# Patient Record
Sex: Male | Born: 1985 | Race: Black or African American | Hispanic: No | Smoking: Current every day smoker
Health system: Southern US, Community
[De-identification: ages and names within clinical notes are randomized; demographics above are authoritative.]

## PROBLEM LIST (undated history)

## (undated) DIAGNOSIS — W3400XA Accidental discharge from unspecified firearms or gun, initial encounter: Secondary | ICD-10-CM

## (undated) HISTORY — PX: NO PAST SURGERIES: SHX2092

---

## 2017-10-29 ENCOUNTER — Emergency Department (HOSPITAL_COMMUNITY): Payer: Medicaid Other

## 2017-10-29 ENCOUNTER — Emergency Department (HOSPITAL_COMMUNITY): Payer: Medicaid Other | Admitting: Anesthesiology

## 2017-10-29 ENCOUNTER — Inpatient Hospital Stay (HOSPITAL_COMMUNITY)
Admission: EM | Admit: 2017-10-29 | Discharge: 2017-11-05 | DRG: 957 | Disposition: A | Discharge status: Home Health | Payer: Medicaid Other | Attending: General Surgery | Admitting: General Surgery

## 2017-10-29 ENCOUNTER — Inpatient Hospital Stay (HOSPITAL_COMMUNITY): Payer: Medicaid Other

## 2017-10-29 ENCOUNTER — Encounter (HOSPITAL_COMMUNITY): Payer: Self-pay | Admitting: Emergency Medicine

## 2017-10-29 ENCOUNTER — Encounter (HOSPITAL_COMMUNITY): Admission: EM | Disposition: A | Discharge status: Home Health | Payer: Self-pay | Source: Home / Self Care

## 2017-10-29 DIAGNOSIS — D72829 Elevated white blood cell count, unspecified: Secondary | ICD-10-CM

## 2017-10-29 DIAGNOSIS — Z23 Encounter for immunization: Secondary | ICD-10-CM

## 2017-10-29 DIAGNOSIS — J9601 Acute respiratory failure with hypoxia: Secondary | ICD-10-CM | POA: Diagnosis not present

## 2017-10-29 DIAGNOSIS — R571 Hypovolemic shock: Secondary | ICD-10-CM | POA: Diagnosis present

## 2017-10-29 DIAGNOSIS — E876 Hypokalemia: Secondary | ICD-10-CM | POA: Diagnosis not present

## 2017-10-29 DIAGNOSIS — W3400XA Accidental discharge from unspecified firearms or gun, initial encounter: Secondary | ICD-10-CM

## 2017-10-29 DIAGNOSIS — S62397B Other fracture of fifth metacarpal bone, left hand, initial encounter for open fracture: Secondary | ICD-10-CM | POA: Diagnosis present

## 2017-10-29 DIAGNOSIS — S37092A Other injury of left kidney, initial encounter: Secondary | ICD-10-CM | POA: Diagnosis present

## 2017-10-29 DIAGNOSIS — S31631A Puncture wound without foreign body of abdominal wall, left upper quadrant with penetration into peritoneal cavity, initial encounter: Principal | ICD-10-CM | POA: Diagnosis present

## 2017-10-29 DIAGNOSIS — S31139A Puncture wound of abdominal wall without foreign body, unspecified quadrant without penetration into peritoneal cavity, initial encounter: Secondary | ICD-10-CM

## 2017-10-29 DIAGNOSIS — K659 Peritonitis, unspecified: Secondary | ICD-10-CM | POA: Diagnosis present

## 2017-10-29 DIAGNOSIS — S3639XA Other injury of stomach, initial encounter: Secondary | ICD-10-CM | POA: Diagnosis present

## 2017-10-29 DIAGNOSIS — S6292XA Unspecified fracture of left wrist and hand, initial encounter for closed fracture: Secondary | ICD-10-CM

## 2017-10-29 DIAGNOSIS — D62 Acute posthemorrhagic anemia: Secondary | ICD-10-CM | POA: Diagnosis not present

## 2017-10-29 DIAGNOSIS — K567 Ileus, unspecified: Secondary | ICD-10-CM | POA: Diagnosis not present

## 2017-10-29 DIAGNOSIS — S36592A Other injury of descending [left] colon, initial encounter: Secondary | ICD-10-CM | POA: Diagnosis present

## 2017-10-29 DIAGNOSIS — S32302A Unspecified fracture of left ilium, initial encounter for closed fracture: Secondary | ICD-10-CM | POA: Diagnosis present

## 2017-10-29 DIAGNOSIS — K117 Disturbances of salivary secretion: Secondary | ICD-10-CM

## 2017-10-29 DIAGNOSIS — R739 Hyperglycemia, unspecified: Secondary | ICD-10-CM | POA: Diagnosis not present

## 2017-10-29 DIAGNOSIS — S36115A Moderate laceration of liver, initial encounter: Secondary | ICD-10-CM | POA: Diagnosis present

## 2017-10-29 DIAGNOSIS — R109 Unspecified abdominal pain: Secondary | ICD-10-CM | POA: Diagnosis present

## 2017-10-29 HISTORY — DX: Accidental discharge from unspecified firearms or gun, initial encounter: W34.00XA

## 2017-10-29 HISTORY — PX: EXPLORATORY LAPAROTOMY: SUR591

## 2017-10-29 HISTORY — PX: LAPAROTOMY: SHX154

## 2017-10-29 LAB — POCT I-STAT 3, ART BLOOD GAS (G3+)
ACID-BASE DEFICIT: 6 mmol/L — AB (ref 0.0–2.0)
BICARBONATE: 21.3 mmol/L (ref 20.0–28.0)
O2 Saturation: 100 %
TCO2: 23 mmol/L (ref 22–32)
pCO2 arterial: 45.7 mmHg (ref 32.0–48.0)
pH, Arterial: 7.277 — ABNORMAL LOW (ref 7.350–7.450)
pO2, Arterial: 274 mmHg — ABNORMAL HIGH (ref 83.0–108.0)

## 2017-10-29 LAB — DIC (DISSEMINATED INTRAVASCULAR COAGULATION) PANEL
APTT: 29 s (ref 24–36)
D DIMER QUANT: 3.37 ug{FEU}/mL — AB (ref 0.00–0.50)
INR: 1.35
PLATELETS: 143 10*3/uL — AB (ref 150–400)
PROTHROMBIN TIME: 16.5 s — AB (ref 11.4–15.2)

## 2017-10-29 LAB — URINALYSIS, ROUTINE W REFLEX MICROSCOPIC
BILIRUBIN URINE: NEGATIVE
Glucose, UA: NEGATIVE mg/dL
Ketones, ur: NEGATIVE mg/dL
Leukocytes, UA: NEGATIVE
NITRITE: NEGATIVE
PH: 5 (ref 5.0–8.0)
Protein, ur: 100 mg/dL — AB
RBC / HPF: >50 RBC/hpf — ABNORMAL HIGH (ref 0–5)
SPECIFIC GRAVITY, URINE: 1.021 (ref 1.005–1.030)

## 2017-10-29 LAB — CDS SEROLOGY

## 2017-10-29 LAB — CBC
HEMATOCRIT: 39.7 % (ref 39.0–52.0)
HEMATOCRIT: 35.3 % — AB (ref 39.0–52.0)
Hemoglobin: 12.8 g/dL — ABNORMAL LOW (ref 13.0–17.0)
Hemoglobin: 11.4 g/dL — ABNORMAL LOW (ref 13.0–17.0)
MCH: 31.7 pg (ref 26.0–34.0)
MCH: 30.8 pg (ref 26.0–34.0)
MCHC: 32.2 g/dL (ref 30.0–36.0)
MCHC: 32.3 g/dL (ref 30.0–36.0)
MCV: 98.3 fL (ref 80.0–100.0)
MCV: 95.4 fL (ref 80.0–100.0)
NRBC: 0 % (ref 0.0–0.2)
PLATELETS: 227 10*3/uL (ref 150–400)
Platelets: 134 10*3/uL — ABNORMAL LOW (ref 150–400)
RBC: 4.04 MIL/uL — AB (ref 4.22–5.81)
RBC: 3.7 MIL/uL — AB (ref 4.22–5.81)
RDW: 12.1 % (ref 11.5–15.5)
RDW: 13.2 % (ref 11.5–15.5)
WBC: 10.8 10*3/uL — ABNORMAL HIGH (ref 4.0–10.5)
WBC: 17 10*3/uL — ABNORMAL HIGH (ref 4.0–10.5)
nRBC: 0 % (ref 0.0–0.2)

## 2017-10-29 LAB — COMPREHENSIVE METABOLIC PANEL
ALBUMIN: 4.1 g/dL (ref 3.5–5.0)
ALK PHOS: 37 U/L — AB (ref 38–126)
ALT: 47 U/L — ABNORMAL HIGH (ref 0–44)
ALT: 67 U/L — ABNORMAL HIGH (ref 0–44)
ANION GAP: 14 (ref 5–15)
ANION GAP: 14 (ref 5–15)
AST: 61 U/L — ABNORMAL HIGH (ref 15–41)
AST: 85 U/L — ABNORMAL HIGH (ref 15–41)
Albumin: 2.8 g/dL — ABNORMAL LOW (ref 3.5–5.0)
Alkaline Phosphatase: 40 U/L (ref 38–126)
BUN: 12 mg/dL (ref 6–20)
BUN: 11 mg/dL (ref 6–20)
CALCIUM: 7.6 mg/dL — AB (ref 8.9–10.3)
CO2: 18 mmol/L — AB (ref 22–32)
CO2: 18 mmol/L — AB (ref 22–32)
Calcium: 8.7 mg/dL — ABNORMAL LOW (ref 8.9–10.3)
Chloride: 106 mmol/L (ref 98–111)
Chloride: 107 mmol/L (ref 98–111)
Creatinine, Ser: 1.43 mg/dL — ABNORMAL HIGH (ref 0.61–1.24)
Creatinine, Ser: 1.13 mg/dL (ref 0.61–1.24)
GFR calc non Af Amer: 28 mL/min — ABNORMAL LOW (ref >60)
GFR, EST AFRICAN AMERICAN: 33 mL/min — AB (ref >60)
GFR, EST AFRICAN AMERICAN: 44 mL/min — AB (ref >60)
GFR, EST NON AFRICAN AMERICAN: 38 mL/min — AB (ref >60)
GLUCOSE: 152 mg/dL — AB (ref 70–99)
Glucose, Bld: 153 mg/dL — ABNORMAL HIGH (ref 70–99)
POTASSIUM: 3.1 mmol/L — AB (ref 3.5–5.1)
Potassium: 3.8 mmol/L (ref 3.5–5.1)
SODIUM: 138 mmol/L (ref 135–145)
SODIUM: 139 mmol/L (ref 135–145)
TOTAL PROTEIN: 4.7 g/dL — AB (ref 6.5–8.1)
Total Bilirubin: 1.2 mg/dL (ref 0.3–1.2)
Total Bilirubin: 0.8 mg/dL (ref 0.3–1.2)
Total Protein: 6.6 g/dL (ref 6.5–8.1)

## 2017-10-29 LAB — POCT I-STAT, CHEM 8
BUN: 13 mg/dL (ref 6–20)
CALCIUM ION: 1.07 mmol/L — AB (ref 1.15–1.40)
Chloride: 103 mmol/L (ref 98–111)
Creatinine, Ser: 1.7 mg/dL — ABNORMAL HIGH (ref 0.61–1.24)
GLUCOSE: 149 mg/dL — AB (ref 70–99)
HEMATOCRIT: 41 % (ref 39.0–52.0)
Hemoglobin: 13.9 g/dL (ref 13.0–17.0)
Potassium: 3.2 mmol/L — ABNORMAL LOW (ref 3.5–5.1)
SODIUM: 141 mmol/L (ref 135–145)
TCO2: 19 mmol/L — AB (ref 22–32)

## 2017-10-29 LAB — GLUCOSE, CAPILLARY
Glucose-Capillary: 111 mg/dL — ABNORMAL HIGH (ref 70–99)
Glucose-Capillary: 113 mg/dL — ABNORMAL HIGH (ref 70–99)
Glucose-Capillary: 119 mg/dL — ABNORMAL HIGH (ref 70–99)
Glucose-Capillary: 115 mg/dL — ABNORMAL HIGH (ref 70–99)

## 2017-10-29 LAB — ABO/RH: ABO/RH(D): O POS

## 2017-10-29 LAB — I-STAT CHEM 8, ED
BUN: 13 mg/dL (ref 8–23)
CALCIUM ION: 1.07 mmol/L — AB (ref 1.15–1.40)
CHLORIDE: 103 mmol/L (ref 98–111)
Creatinine, Ser: 1.7 mg/dL — ABNORMAL HIGH (ref 0.61–1.24)
Glucose, Bld: 149 mg/dL — ABNORMAL HIGH (ref 70–99)
HEMATOCRIT: 41 % (ref 39.0–52.0)
Hemoglobin: 13.9 g/dL (ref 13.0–17.0)
POTASSIUM: 3.2 mmol/L — AB (ref 3.5–5.1)
SODIUM: 141 mmol/L (ref 135–145)
TCO2: 19 mmol/L — ABNORMAL LOW (ref 22–32)

## 2017-10-29 LAB — MRSA PCR SCREENING: MRSA BY PCR: NEGATIVE

## 2017-10-29 LAB — BLOOD PRODUCT ORDER (VERBAL) VERIFICATION

## 2017-10-29 LAB — ETHANOL: Alcohol, Ethyl (B): 154 mg/dL — ABNORMAL HIGH (ref <10)

## 2017-10-29 LAB — PROTIME-INR
INR: 1.12
Prothrombin Time: 14.3 seconds (ref 11.4–15.2)

## 2017-10-29 LAB — DIC (DISSEMINATED INTRAVASCULAR COAGULATION)PANEL
Fibrinogen: 199 mg/dL — ABNORMAL LOW (ref 210–475)
Smear Review: NONE SEEN

## 2017-10-29 LAB — HIV ANTIBODY (ROUTINE TESTING W REFLEX): HIV SCREEN 4TH GENERATION: NONREACTIVE

## 2017-10-29 LAB — I-STAT CG4 LACTIC ACID, ED: Lactic Acid, Venous: 7.2 mmol/L (ref 0.5–1.9)

## 2017-10-29 SURGERY — LAPAROTOMY, EXPLORATORY
Anesthesia: General | Site: Abdomen

## 2017-10-29 MED ORDER — 0.9 % SODIUM CHLORIDE (POUR BTL) OPTIME
TOPICAL | Status: DC | PRN
  Administered 2017-10-29: 1000 mL

## 2017-10-29 MED ORDER — FENTANYL CITRATE (PF) 250 MCG/5ML IJ SOLN
INTRAMUSCULAR | Status: DC | PRN
  Administered 2017-10-29: 100 ug via INTRAVENOUS
  Administered 2017-10-29: 50 ug via INTRAVENOUS
  Administered 2017-10-29: 100 ug via INTRAVENOUS

## 2017-10-29 MED ORDER — CEFAZOLIN SODIUM-DEXTROSE 1-4 GM/50ML-% IV SOLN
1.0000 g | Freq: Three times a day (TID) | INTRAVENOUS | Status: DC
  Filled 2017-10-29: qty 50

## 2017-10-29 MED ORDER — FENTANYL CITRATE (PF) 100 MCG/2ML IJ SOLN
INTRAMUSCULAR | Status: AC | PRN
  Administered 2017-10-29: 100 ug via INTRAVENOUS

## 2017-10-29 MED ORDER — PROPOFOL 10 MG/ML IV BOLUS
INTRAVENOUS | Status: DC | PRN
  Administered 2017-10-29: 130 mg via INTRAVENOUS

## 2017-10-29 MED ORDER — CHLORHEXIDINE GLUCONATE 0.12% ORAL RINSE (MEDLINE KIT)
15.0000 mL | Freq: Two times a day (BID) | OROMUCOSAL | Status: DC
  Administered 2017-10-29 – 2017-11-04 (×10): 15 mL via OROMUCOSAL

## 2017-10-29 MED ORDER — CEFAZOLIN SODIUM 1 G IJ SOLR
INTRAMUSCULAR | Status: AC
  Filled 2017-10-29: qty 40

## 2017-10-29 MED ORDER — CALCIUM CHLORIDE 10 % IV SOLN
INTRAVENOUS | Status: AC
  Filled 2017-10-29: qty 10

## 2017-10-29 MED ORDER — CEFAZOLIN SODIUM-DEXTROSE 1-4 GM/50ML-% IV SOLN
1.0000 g | Freq: Once | INTRAVENOUS | Status: AC
  Administered 2017-10-29: 2 g via INTRAVENOUS

## 2017-10-29 MED ORDER — ROCURONIUM BROMIDE 50 MG/5ML IV SOSY
PREFILLED_SYRINGE | INTRAVENOUS | Status: AC
  Filled 2017-10-29: qty 10

## 2017-10-29 MED ORDER — FENTANYL CITRATE (PF) 100 MCG/2ML IJ SOLN
INTRAMUSCULAR | Status: AC
  Filled 2017-10-29: qty 2

## 2017-10-29 MED ORDER — SODIUM CHLORIDE 0.9 % IV SOLN
INTRAVENOUS | Status: DC
  Administered 2017-10-29 – 2017-10-30 (×3): via INTRAVENOUS

## 2017-10-29 MED ORDER — DEXMEDETOMIDINE HCL IN NACL 400 MCG/100ML IV SOLN
0.0000 ug/kg/h | INTRAVENOUS | Status: DC
  Administered 2017-10-29 (×3): 1.2 ug/kg/h via INTRAVENOUS
  Administered 2017-10-29: 0.6 ug/kg/h via INTRAVENOUS
  Administered 2017-10-29 – 2017-10-30 (×3): 1.2 ug/kg/h via INTRAVENOUS
  Filled 2017-10-29 (×7): qty 100

## 2017-10-29 MED ORDER — DOUBLE ANTIBIOTIC 500-10000 UNIT/GM EX OINT
TOPICAL_OINTMENT | CUTANEOUS | Status: AC
  Filled 2017-10-29: qty 1

## 2017-10-29 MED ORDER — FENTANYL BOLUS VIA INFUSION
50.0000 ug | INTRAVENOUS | Status: DC | PRN
  Administered 2017-10-29: 50 ug via INTRAVENOUS
  Filled 2017-10-29: qty 50

## 2017-10-29 MED ORDER — INSULIN ASPART 100 UNIT/ML ~~LOC~~ SOLN: 0.0000 [IU] | SUBCUTANEOUS | Status: DC

## 2017-10-29 MED ORDER — ONDANSETRON HCL 4 MG/2ML IJ SOLN
INTRAMUSCULAR | Status: AC
  Filled 2017-10-29: qty 2

## 2017-10-29 MED ORDER — MIDAZOLAM HCL 2 MG/2ML IJ SOLN
INTRAMUSCULAR | Status: AC
  Filled 2017-10-29: qty 2

## 2017-10-29 MED ORDER — MIDAZOLAM HCL 2 MG/2ML IJ SOLN
INTRAMUSCULAR | Status: DC | PRN
  Administered 2017-10-29: 2 mg via INTRAVENOUS

## 2017-10-29 MED ORDER — PROPOFOL 1000 MG/100ML IV EMUL: 5.0000 ug/kg/min | INTRAVENOUS | Status: DC

## 2017-10-29 MED ORDER — FAMOTIDINE IN NACL 20-0.9 MG/50ML-% IV SOLN
20.0000 mg | Freq: Two times a day (BID) | INTRAVENOUS | Status: DC
  Administered 2017-10-29 – 2017-11-03 (×12): 20 mg via INTRAVENOUS
  Filled 2017-10-29 (×13): qty 50

## 2017-10-29 MED ORDER — SUCCINYLCHOLINE CHLORIDE 200 MG/10ML IV SOSY
PREFILLED_SYRINGE | INTRAVENOUS | Status: AC
  Filled 2017-10-29: qty 10

## 2017-10-29 MED ORDER — PROPOFOL 10 MG/ML IV BOLUS
INTRAVENOUS | Status: AC
  Filled 2017-10-29: qty 20

## 2017-10-29 MED ORDER — ALBUMIN HUMAN 5 % IV SOLN
INTRAVENOUS | Status: DC | PRN
  Administered 2017-10-29: 04:00:00 via INTRAVENOUS

## 2017-10-29 MED ORDER — BACITRACIN-NEOMYCIN-POLYMYXIN OINTMENT TUBE
TOPICAL_OINTMENT | CUTANEOUS | Status: DC | PRN
  Administered 2017-10-29: 1 via TOPICAL

## 2017-10-29 MED ORDER — ROCURONIUM BROMIDE 100 MG/10ML IV SOLN
INTRAVENOUS | Status: DC | PRN
  Administered 2017-10-29: 50 mg via INTRAVENOUS
  Administered 2017-10-29: 30 mg via INTRAVENOUS
  Administered 2017-10-29: 20 mg via INTRAVENOUS

## 2017-10-29 MED ORDER — SUCCINYLCHOLINE CHLORIDE 20 MG/ML IJ SOLN
INTRAMUSCULAR | Status: DC | PRN
  Administered 2017-10-29: 140 mg via INTRAVENOUS

## 2017-10-29 MED ORDER — TETANUS-DIPHTH-ACELL PERTUSSIS 5-2.5-18.5 LF-MCG/0.5 IM SUSP: 0.5000 mL | Freq: Once | INTRAMUSCULAR | Status: DC

## 2017-10-29 MED ORDER — FENTANYL 2500MCG IN NS 250ML (10MCG/ML) PREMIX INFUSION
25.0000 ug/h | INTRAVENOUS | Status: DC
  Administered 2017-10-29: 300 ug/h via INTRAVENOUS
  Administered 2017-10-29 (×2): 350 ug/h via INTRAVENOUS
  Administered 2017-10-30: 250 ug/h via INTRAVENOUS
  Filled 2017-10-29 (×4): qty 250

## 2017-10-29 MED ORDER — LIDOCAINE 2% (20 MG/ML) 5 ML SYRINGE
INTRAMUSCULAR | Status: AC
  Filled 2017-10-29: qty 5

## 2017-10-29 MED ORDER — SODIUM CHLORIDE 0.9 % IJ SOLN
INTRAMUSCULAR | Status: AC
  Filled 2017-10-29: qty 10

## 2017-10-29 MED ORDER — FENTANYL CITRATE (PF) 250 MCG/5ML IJ SOLN
INTRAMUSCULAR | Status: AC
  Filled 2017-10-29: qty 5

## 2017-10-29 MED ORDER — FENTANYL CITRATE (PF) 100 MCG/2ML IJ SOLN
50.0000 ug | Freq: Once | INTRAMUSCULAR | Status: DC
  Filled 2017-10-29: qty 2

## 2017-10-29 MED ORDER — ORAL CARE MOUTH RINSE
15.0000 mL | OROMUCOSAL | Status: DC
  Administered 2017-10-29 – 2017-10-30 (×11): 15 mL via OROMUCOSAL

## 2017-10-29 MED ORDER — CALCIUM CHLORIDE 10 % IV SOLN
INTRAVENOUS | Status: DC | PRN
  Administered 2017-10-29 (×3): 200 mg via INTRAVENOUS
  Administered 2017-10-29: 400 mg via INTRAVENOUS

## 2017-10-29 MED ORDER — LACTATED RINGERS IV SOLN
INTRAVENOUS | Status: DC | PRN
  Administered 2017-10-29 (×2): via INTRAVENOUS

## 2017-10-29 MED ORDER — SODIUM CHLORIDE 0.9 % IV SOLN
2.0000 g | Freq: Two times a day (BID) | INTRAVENOUS | Status: AC
  Administered 2017-10-29 – 2017-10-30 (×3): 2 g via INTRAVENOUS
  Filled 2017-10-29 (×7): qty 2

## 2017-10-29 MED ORDER — SODIUM CHLORIDE 0.9 % IV SOLN
INTRAVENOUS | Status: AC | PRN
  Administered 2017-10-29 (×2): 1000 mL via INTRAVENOUS

## 2017-10-29 MED ORDER — PHENYLEPHRINE 40 MCG/ML (10ML) SYRINGE FOR IV PUSH (FOR BLOOD PRESSURE SUPPORT)
PREFILLED_SYRINGE | INTRAVENOUS | Status: AC
  Filled 2017-10-29: qty 10

## 2017-10-29 MED ORDER — ARTIFICIAL TEARS OPHTHALMIC OINT
TOPICAL_OINTMENT | OPHTHALMIC | Status: AC
  Filled 2017-10-29: qty 3.5

## 2017-10-29 MED ORDER — EPHEDRINE 5 MG/ML INJ
INTRAVENOUS | Status: AC
  Filled 2017-10-29: qty 10

## 2017-10-29 SURGICAL SUPPLY — 61 items
BLADE CLIPPER SURG (BLADE) ×2 IMPLANT
BNDG GAUZE ELAST 4 BULKY (GAUZE/BANDAGES/DRESSINGS) ×4 IMPLANT
COVER SURGICAL LIGHT HANDLE (MISCELLANEOUS) ×2 IMPLANT
COVER WAND RF STERILE (DRAPES) ×2 IMPLANT
DRAPE LAPAROSCOPIC ABDOMINAL (DRAPES) ×2 IMPLANT
DRAPE WARM FLUID 44X44 (DRAPE) ×2 IMPLANT
DRSG OPSITE POSTOP 4X10 (GAUZE/BANDAGES/DRESSINGS) IMPLANT
DRSG OPSITE POSTOP 4X8 (GAUZE/BANDAGES/DRESSINGS) IMPLANT
ELECT BLADE 4.0 EZ CLEAN MEGAD (MISCELLANEOUS) ×2
ELECT BLADE 6.5 EXT (BLADE) IMPLANT
ELECT CAUTERY BLADE 6.4 (BLADE) ×2 IMPLANT
ELECT REM PT RETURN 9FT ADLT (ELECTROSURGICAL) ×2
ELECTRODE BLDE 4.0 EZ CLN MEGD (MISCELLANEOUS) ×1 IMPLANT
ELECTRODE REM PT RTRN 9FT ADLT (ELECTROSURGICAL) ×1 IMPLANT
EVACUATOR SILICONE 100CC (DRAIN) ×2 IMPLANT
GAUZE SPONGE 4X4 12PLY STRL (GAUZE/BANDAGES/DRESSINGS) ×2 IMPLANT
GAUZE SPONGE 4X4 12PLY STRL LF (GAUZE/BANDAGES/DRESSINGS) ×2 IMPLANT
GLOVE BIOGEL PI IND STRL 7.0 (GLOVE) ×1 IMPLANT
GLOVE BIOGEL PI IND STRL 8 (GLOVE) ×1 IMPLANT
GLOVE BIOGEL PI INDICATOR 7.0 (GLOVE) ×1
GLOVE BIOGEL PI INDICATOR 8 (GLOVE) ×1
GLOVE ECLIPSE 7.5 STRL STRAW (GLOVE) ×2 IMPLANT
GLOVE INDICATOR 7.0 STRL GRN (GLOVE) ×2 IMPLANT
GLOVE SURG SIGNA 7.5 PF LTX (GLOVE) ×2 IMPLANT
GOWN STRL REUS W/ TWL LRG LVL3 (GOWN DISPOSABLE) ×2 IMPLANT
GOWN STRL REUS W/TWL LRG LVL3 (GOWN DISPOSABLE) ×2
HEMOSTAT SNOW SURGICEL 2X4 (HEMOSTASIS) ×2 IMPLANT
KIT BASIN OR (CUSTOM PROCEDURE TRAY) ×2 IMPLANT
KIT OSTOMY DRAINABLE 2.75 STR (WOUND CARE) ×2 IMPLANT
KIT TURNOVER KIT B (KITS) ×2 IMPLANT
LIGASURE IMPACT 36 18CM CVD LR (INSTRUMENTS) ×2 IMPLANT
NS IRRIG 1000ML POUR BTL (IV SOLUTION) ×4 IMPLANT
PACK GENERAL/GYN (CUSTOM PROCEDURE TRAY) ×2 IMPLANT
PAD ABD 8X10 STRL (GAUZE/BANDAGES/DRESSINGS) ×2 IMPLANT
PAD ARMBOARD 7.5X6 YLW CONV (MISCELLANEOUS) ×2 IMPLANT
RELOAD PROXIMATE 30MM BLUE (ENDOMECHANICALS) ×2 IMPLANT
RELOAD PROXIMATE 75MM BLUE (ENDOMECHANICALS) ×2 IMPLANT
RELOAD STAPLER LINEAR PROX 30 (STAPLE) ×1 IMPLANT
SEALANT PATCH FIBRIN 2X4IN (MISCELLANEOUS) ×4 IMPLANT
SEPRAFILM PROCEDURAL PACK 3X5 (MISCELLANEOUS) IMPLANT
SPECIMEN JAR LARGE (MISCELLANEOUS) IMPLANT
SPONGE LAP 18X18 X RAY DECT (DISPOSABLE) ×2 IMPLANT
STAPLER PROXIMATE 75MM BLUE (STAPLE) ×2 IMPLANT
STAPLER RELOAD LINEAR PROX 30 (STAPLE) ×2
STAPLER VISISTAT 35W (STAPLE) ×2 IMPLANT
SUCTION POOLE TIP (SUCTIONS) ×2 IMPLANT
SUT ETHILON 2 0 FS 18 (SUTURE) ×2 IMPLANT
SUT NOVA 1 T20/GS 25DT (SUTURE) IMPLANT
SUT PDS AB 1 TP1 96 (SUTURE) ×4 IMPLANT
SUT PROLENE 2 0 CT2 30 (SUTURE) ×2 IMPLANT
SUT SILK 2 0 SH CR/8 (SUTURE) ×4 IMPLANT
SUT SILK 2 0 TIES 10X30 (SUTURE) ×2 IMPLANT
SUT SILK 3 0 SH CR/8 (SUTURE) ×2 IMPLANT
SUT SILK 3 0 TIES 10X30 (SUTURE) ×2 IMPLANT
SUT VIC AB 2-0 SH 18 (SUTURE) ×2 IMPLANT
SUT VIC AB 3-0 SH 18 (SUTURE) ×2 IMPLANT
TAPE CLOTH SURG 4X10 WHT LF (GAUZE/BANDAGES/DRESSINGS) ×2 IMPLANT
TOWEL OR 17X26 10 PK STRL BLUE (TOWEL DISPOSABLE) ×2 IMPLANT
TRAY FOLEY MTR SLVR 14FR STAT (SET/KITS/TRAYS/PACK) ×2 IMPLANT
TRAY FOLEY MTR SLVR 16FR STAT (SET/KITS/TRAYS/PACK) IMPLANT
YANKAUER SUCT BULB TIP NO VENT (SUCTIONS) IMPLANT

## 2017-10-29 NOTE — Transfer of Care (Signed)
Immediate Anesthesia Transfer of Care Note  Patient: Luis Castaneda.  Procedure(s) Performed: EXPLORATORY LAPAROTOMY, REPAIR OF STOMACH TIMES TWO AND LIVER TIMES ONE. PARTIAL COLECTOMY, PARTIAL LEFT NEPHRECTOMY., WASHOUT AND DRESS LEFT HAND BULLET WOUNDS TIMES FOUR. (N/A Abdomen)  Patient Location: ICU  Anesthesia Type:General  Level of Consciousness: sedated  Airway & Oxygen Therapy: Patient remains intubated per anesthesia plan and Patient placed on Ventilator (see vital sign flow sheet for setting)  Post-op Assessment: Report given to RN and Post -op Vital signs reviewed and stable  Post vital signs: Reviewed and stable  Last Vitals:  Vitals Value Taken Time  BP 130/95 10/29/2017  5:17 AM  Temp    Pulse    Resp 17 10/29/2017  5:22 AM  SpO2    Vitals shown include unvalidated device data.  Last Pain:  Vitals:   10/29/17 0248  TempSrc:   PainSc: 10-Worst pain ever         Complications: No apparent anesthesia complications
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
## 2017-10-29 NOTE — ED Notes (Signed)
All patient's clothing and personal belongings (cut jeans, wallet, monies, etc) placed in a paper bag and given to officer Vanessa [place].

## 2017-10-29 NOTE — Progress Notes (Signed)
Orthopedic Tech Progress Note Patient Details:  Luis Castaneda. [birth year] [id number]  Ortho Devices Type of Ortho Device: Ace wrap, Ulna gutter splint Ortho Device/Splint Location: lue Ortho Device/Splint Interventions: Application   Post Interventions Patient Tolerated: Well Instructions Provided: Care of device   Nikki Dom 10/29/2017, 4:41 PM
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
## 2017-10-29 NOTE — Anesthesia Procedure Notes (Signed)
Procedure Name: Intubation Date/Time: 10/29/2017 3:20 AM Performed by: Claudina Lick, CRNA Pre-anesthesia Checklist: Patient identified, Emergency Drugs available, Suction available, Patient being monitored and Timeout performed Patient Re-evaluated:Patient Re-evaluated prior to induction Oxygen Delivery Method: Circle system utilized Preoxygenation: Pre-oxygenation with 100% oxygen Induction Type: IV induction, Rapid sequence and Cricoid Pressure applied Laryngoscope Size: Miller and 2 Grade View: Grade I Tube type: Subglottic suction tube Tube size: 7.5 mm Number of attempts: 1 Airway Equipment and Method: Stylet Placement Confirmation: ETT inserted through vocal cords under direct vision Secured at: 23 cm Tube secured with: Tape Dental Injury: Teeth and Oropharynx as per pre-operative assessment

## 2017-10-29 NOTE — Anesthesia Preprocedure Evaluation (Addendum)
Anesthesia Evaluation  Patient identified by MRN, date of birth, ID band Patient awake    Reviewed: Allergy & Precautions, Patient's Chart, lab work & pertinent test results, Unable to perform ROS - Chart review onlyPreop documentation limited or incomplete due to emergent nature of procedure.  Airway Mallampati: I  TM Distance: >3 FB Neck ROM: Full    Dental  (+) Poor Dentition, Missing, Loose, Chipped   Pulmonary    breath sounds clear to auscultation       Cardiovascular  Rhythm:Regular Rate:Tachycardia     Neuro/Psych    GI/Hepatic   Endo/Other    Renal/GU      Musculoskeletal   Abdominal (+)  Abdomen: tender.    Peds  Hematology   Anesthesia Other Findings   Reproductive/Obstetrics                             Anesthesia Physical Anesthesia Plan  ASA: III and emergent  Anesthesia Plan: General   Post-op Pain Management:    Induction: Intravenous  PONV Risk Score and Plan: 3 and Ondansetron and Treatment may vary due to age or medical condition  Airway Management Planned: Oral ETT  Additional Equipment: Arterial line  Intra-op Plan:   Post-operative Plan: Possible Post-op intubation/ventilation  Informed Consent: I have reviewed the patients History and Physical, chart, labs and discussed the procedure including the risks, benefits and alternatives for the proposed anesthesia with the patient or authorized representative who has indicated his/her understanding and acceptance.   Only emergency history available and History available from chart only  Plan Discussed with: CRNA, Anesthesiologist and Surgeon  Anesthesia Plan Comments:        Anesthesia Quick Evaluation

## 2017-10-29 NOTE — ED Notes (Addendum)
1st unit PRBC/1st FFP infusing , EDP/Dr. Lindie Spruce at bedside .
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
## 2017-10-29 NOTE — Anesthesia Procedure Notes (Signed)
Arterial Line Insertion Start/End10/27/2019 3:21 AM, 10/29/2017 3:24 AM Performed by: [name], MD, anesthesiologist  Patient location: Pre-op. Preanesthetic checklist: patient identified, IV checked, site marked, risks and benefits discussed, surgical consent, monitors and equipment checked, pre-op evaluation, timeout performed and anesthesia consent Lidocaine 1% used for infiltration radial was placed Catheter size: 20 Fr Hand hygiene performed  and maximum sterile barriers used   Attempts: 1 Procedure performed without using ultrasound guided technique. Following insertion, dressing applied. Post procedure assessment: normal and unchanged  Patient tolerated the procedure well with no immediate complications.

## 2017-10-30 ENCOUNTER — Inpatient Hospital Stay (HOSPITAL_COMMUNITY): Payer: Medicaid Other

## 2017-10-30 ENCOUNTER — Encounter (HOSPITAL_COMMUNITY): Payer: Self-pay | Admitting: General Surgery

## 2017-10-30 LAB — PREPARE FRESH FROZEN PLASMA
UNIT DIVISION: 0
UNIT DIVISION: 0
UNIT DIVISION: 0
UNIT DIVISION: 0
UNIT DIVISION: 0
Unit division: 0
Unit division: 0
Unit division: 0
Unit division: 0
Unit division: 0
Unit division: 0

## 2017-10-30 LAB — POCT I-STAT 7, (LYTES, BLD GAS, ICA,H+H)
Acid-base deficit: 5 mmol/L — ABNORMAL HIGH (ref 0.0–2.0)
Acid-base deficit: 7 mmol/L — ABNORMAL HIGH (ref 0.0–2.0)
Bicarbonate: 22.3 mmol/L (ref 20.0–28.0)
Bicarbonate: 18.8 mmol/L — ABNORMAL LOW (ref 20.0–28.0)
CALCIUM ION: 1.12 mmol/L — AB (ref 1.15–1.40)
Calcium, Ion: 1.38 mmol/L (ref 1.15–1.40)
HEMATOCRIT: 32 % — AB (ref 39.0–52.0)
HEMATOCRIT: 37 % — AB (ref 39.0–52.0)
HEMOGLOBIN: 12.6 g/dL — AB (ref 13.0–17.0)
Hemoglobin: 10.9 g/dL — ABNORMAL LOW (ref 13.0–17.0)
O2 SAT: 100 %
O2 SAT: 100 %
PCO2 ART: 46.9 mmHg (ref 32.0–48.0)
PH ART: 7.274 — AB (ref 7.350–7.450)
PH ART: 7.331 — AB (ref 7.350–7.450)
POTASSIUM: 3.8 mmol/L (ref 3.5–5.1)
POTASSIUM: 3.6 mmol/L (ref 3.5–5.1)
Patient temperature: 35.1
Sodium: 141 mmol/L (ref 135–145)
Sodium: 141 mmol/L (ref 135–145)
TCO2: 24 mmol/L (ref 22–32)
TCO2: 20 mmol/L — ABNORMAL LOW (ref 22–32)
pCO2 arterial: 34.8 mmHg (ref 32.0–48.0)
pO2, Arterial: 571 mmHg — ABNORMAL HIGH (ref 83.0–108.0)
pO2, Arterial: 293 mmHg — ABNORMAL HIGH (ref 83.0–108.0)

## 2017-10-30 LAB — POCT I-STAT 3, ART BLOOD GAS (G3+)
Acid-base deficit: 2 mmol/L (ref 0.0–2.0)
Bicarbonate: 23.9 mmol/L (ref 20.0–28.0)
O2 SAT: 99 %
PCO2 ART: 42.5 mmHg (ref 32.0–48.0)
PO2 ART: 147 mmHg — AB (ref 83.0–108.0)
Patient temperature: 98.5
TCO2: 25 mmol/L (ref 22–32)
pH, Arterial: 7.359 (ref 7.350–7.450)

## 2017-10-30 LAB — CBC
HEMATOCRIT: 41.7 % (ref 39.0–52.0)
HEMOGLOBIN: 13.7 g/dL (ref 13.0–17.0)
MCH: 30.5 pg (ref 26.0–34.0)
MCHC: 32.9 g/dL (ref 30.0–36.0)
MCV: 92.9 fL (ref 80.0–100.0)
NRBC: 0 % (ref 0.0–0.2)
Platelets: 106 10*3/uL — ABNORMAL LOW (ref 150–400)
RBC: 4.49 MIL/uL (ref 4.22–5.81)
RDW: 14.4 % (ref 11.5–15.5)
WBC: 18.6 10*3/uL — AB (ref 4.0–10.5)

## 2017-10-30 LAB — PROTIME-INR
INR: 1.32
PROTHROMBIN TIME: 16.2 s — AB (ref 11.4–15.2)

## 2017-10-30 LAB — BPAM FFP
BLOOD PRODUCT EXPIRATION DATE: 201910312359
BLOOD PRODUCT EXPIRATION DATE: 201910312359
BLOOD PRODUCT EXPIRATION DATE: 201910312359
BLOOD PRODUCT EXPIRATION DATE: 201910312359
BLOOD PRODUCT EXPIRATION DATE: 201911012359
Blood Product Expiration Date: 201910312359
Blood Product Expiration Date: 201910312359
Blood Product Expiration Date: 201910312359
Blood Product Expiration Date: 201910312359
Blood Product Expiration Date: 201910312359
Blood Product Expiration Date: 201910312359
Blood Product Expiration Date: 201911012359
ISSUE DATE / TIME: 201910270300
ISSUE DATE / TIME: 201910270333
ISSUE DATE / TIME: 201910270245
ISSUE DATE / TIME: 201910270245
ISSUE DATE / TIME: 201910270300
ISSUE DATE / TIME: 201910270333
ISSUE DATE / TIME: 201910270333
ISSUE DATE / TIME: 201910270333
UNIT TYPE AND RH: 5100
UNIT TYPE AND RH: 5100
UNIT TYPE AND RH: 600
UNIT TYPE AND RH: 6200
UNIT TYPE AND RH: 6200
UNIT TYPE AND RH: 6200
Unit Type and Rh: 5100
Unit Type and Rh: 5100
Unit Type and Rh: 6200
Unit Type and Rh: 6200
Unit Type and Rh: 6200
Unit Type and Rh: 6200

## 2017-10-30 LAB — GLUCOSE, CAPILLARY
GLUCOSE-CAPILLARY: 110 mg/dL — AB (ref 70–99)
GLUCOSE-CAPILLARY: 118 mg/dL — AB (ref 70–99)
Glucose-Capillary: 104 mg/dL — ABNORMAL HIGH (ref 70–99)

## 2017-10-30 LAB — COMPREHENSIVE METABOLIC PANEL
ALK PHOS: 36 U/L — AB (ref 38–126)
ALT: 99 U/L — AB (ref 0–44)
AST: 117 U/L — AB (ref 15–41)
Albumin: 2.9 g/dL — ABNORMAL LOW (ref 3.5–5.0)
Anion gap: 5 (ref 5–15)
BUN: 10 mg/dL (ref 6–20)
CO2: 24 mmol/L (ref 22–32)
CREATININE: 1.05 mg/dL (ref 0.61–1.24)
Calcium: 8 mg/dL — ABNORMAL LOW (ref 8.9–10.3)
Chloride: 108 mmol/L (ref 98–111)
Glucose, Bld: 112 mg/dL — ABNORMAL HIGH (ref 70–99)
Potassium: 3.9 mmol/L (ref 3.5–5.1)
Sodium: 137 mmol/L (ref 135–145)
TOTAL PROTEIN: 5.4 g/dL — AB (ref 6.5–8.1)
Total Bilirubin: 1.1 mg/dL (ref 0.3–1.2)

## 2017-10-30 LAB — HEMOGLOBIN A1C
HEMOGLOBIN A1C: 5.1 % (ref 4.8–5.6)
MEAN PLASMA GLUCOSE: 99.67 mg/dL

## 2017-10-30 LAB — TRIGLYCERIDES: Triglycerides: 27 mg/dL (ref <150)

## 2017-10-30 MED ORDER — SODIUM CHLORIDE 0.9 % IV SOLN: INTRAVENOUS | Status: DC

## 2017-10-30 MED ORDER — IOHEXOL 300 MG/ML  SOLN
100.0000 mL | Freq: Once | INTRAMUSCULAR | Status: AC | PRN
  Administered 2017-10-30: 100 mL via INTRAVENOUS

## 2017-10-30 MED ORDER — FENTANYL CITRATE (PF) 100 MCG/2ML IJ SOLN
50.0000 ug | INTRAMUSCULAR | Status: DC | PRN
  Administered 2017-10-30 (×2): 100 ug via INTRAVENOUS
  Administered 2017-10-30: 50 ug via INTRAVENOUS
  Administered 2017-10-30: 100 ug via INTRAVENOUS
  Administered 2017-10-30 (×2): 50 ug via INTRAVENOUS
  Administered 2017-10-31 (×2): 100 ug via INTRAVENOUS
  Administered 2017-10-31: 50 ug via INTRAVENOUS
  Administered 2017-10-31: 100 ug via INTRAVENOUS
  Filled 2017-10-30 (×9): qty 2

## 2017-10-30 MED ORDER — POTASSIUM CHLORIDE IN NACL 20-0.9 MEQ/L-% IV SOLN
INTRAVENOUS | Status: DC
  Administered 2017-10-30 – 2017-11-03 (×9): via INTRAVENOUS
  Filled 2017-10-30 (×12): qty 1000

## 2017-10-30 MED ORDER — WHITE PETROLATUM EX OINT
TOPICAL_OINTMENT | CUTANEOUS | Status: AC
  Administered 2017-10-30: 1
  Filled 2017-10-30: qty 28.35

## 2017-10-30 MED ORDER — CHLORHEXIDINE GLUCONATE 0.12 % MT SOLN
OROMUCOSAL | Status: AC
  Filled 2017-10-30: qty 15

## 2017-10-30 NOTE — Procedures (Signed)
Extubation Procedure Note  Patient Details:   Name: Luis Castaneda. DOB: [birth year] MRN: [id number]   Airway Documentation:  Airway 7.5 mm (Active)  Secured at (cm) 24 cm 10/30/2017  7:58 AM  Measured From Lips 10/30/2017  7:58 AM  Secured Location Center 10/30/2017  7:58 AM  Secured By [employer] 10/30/2017  7:58 AM  Tube Holder Repositioned Yes 10/30/2017  7:58 AM  Cuff Pressure (cm H2O) 26 cm H2O 10/30/2017  7:58 AM  Site Condition Dry 10/30/2017  7:58 AM   Vent end date: (not recorded) Vent end time: (not recorded)   Evaluation  O2 sats: stable throughout Complications: No apparent complications Patient did tolerate procedure well. Bilateral Breath Sounds: Clear    Patient was extubated at 1009 per MD order to 4L nasal cannula. Patient was able to vocalize. Vitals remained stable during and after extubation. RT will continue to monitor.   Farris Has 10/30/2017, 10:13 AM
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
## 2017-10-31 LAB — POCT I-STAT 7, (LYTES, BLD GAS, ICA,H+H)
Acid-base deficit: 6 mmol/L — ABNORMAL HIGH (ref 0.0–2.0)
Bicarbonate: 20.3 mmol/L (ref 20.0–28.0)
CALCIUM ION: 0.96 mmol/L — AB (ref 1.15–1.40)
HCT: 20 % — ABNORMAL LOW (ref 39.0–52.0)
Hemoglobin: 6.8 g/dL — CL (ref 13.0–17.0)
O2 SAT: 100 %
PCO2 ART: 38.6 mmHg (ref 32.0–48.0)
PO2 ART: 327 mmHg — AB (ref 83.0–108.0)
Patient temperature: 35
Potassium: 3.9 mmol/L (ref 3.5–5.1)
Sodium: 105 mmol/L — CL (ref 135–145)
TCO2: 22 mmol/L (ref 22–32)
pH, Arterial: 7.319 — ABNORMAL LOW (ref 7.350–7.450)

## 2017-10-31 LAB — BASIC METABOLIC PANEL
Anion gap: 12 (ref 5–15)
BUN: 6 mg/dL (ref 6–20)
CHLORIDE: 100 mmol/L (ref 98–111)
CO2: 26 mmol/L (ref 22–32)
CREATININE: 1.03 mg/dL (ref 0.61–1.24)
Calcium: 8.7 mg/dL — ABNORMAL LOW (ref 8.9–10.3)
GFR calc Af Amer: >60 mL/min (ref >60)
GFR calc non Af Amer: >60 mL/min (ref >60)
Glucose, Bld: 91 mg/dL (ref 70–99)
Potassium: 3.3 mmol/L — ABNORMAL LOW (ref 3.5–5.1)
Sodium: 138 mmol/L (ref 135–145)

## 2017-10-31 LAB — CBC
HEMATOCRIT: 41 % (ref 39.0–52.0)
HEMOGLOBIN: 12.9 g/dL — AB (ref 13.0–17.0)
MCH: 29.4 pg (ref 26.0–34.0)
MCHC: 31.5 g/dL (ref 30.0–36.0)
MCV: 93.4 fL (ref 80.0–100.0)
Platelets: 105 10*3/uL — ABNORMAL LOW (ref 150–400)
RBC: 4.39 MIL/uL (ref 4.22–5.81)
RDW: 14 % (ref 11.5–15.5)
WBC: 19.9 10*3/uL — ABNORMAL HIGH (ref 4.0–10.5)
nRBC: 0 % (ref 0.0–0.2)

## 2017-10-31 MED ORDER — ONDANSETRON HCL 4 MG/2ML IJ SOLN
4.0000 mg | Freq: Three times a day (TID) | INTRAMUSCULAR | Status: DC | PRN
  Administered 2017-10-31 – 2017-11-03 (×4): 4 mg via INTRAVENOUS
  Filled 2017-10-31 (×3): qty 2

## 2017-10-31 MED ORDER — HYDROMORPHONE HCL 1 MG/ML IJ SOLN
1.0000 mg | INTRAMUSCULAR | Status: DC | PRN
  Administered 2017-10-31 (×3): 1 mg via INTRAVENOUS
  Administered 2017-11-01 – 2017-11-02 (×7): 2 mg via INTRAVENOUS
  Administered 2017-11-03: 1 mg via INTRAVENOUS
  Filled 2017-10-31: qty 2
  Filled 2017-10-31: qty 1
  Filled 2017-10-31: qty 2
  Filled 2017-10-31: qty 1
  Filled 2017-10-31: qty 2
  Filled 2017-10-31 (×2): qty 1
  Filled 2017-10-31 (×5): qty 2
  Filled 2017-10-31: qty 1

## 2017-10-31 MED ORDER — POTASSIUM CHLORIDE 10 MEQ/100ML IV SOLN
10.0000 meq | INTRAVENOUS | Status: AC
  Administered 2017-10-31 (×2): 10 meq via INTRAVENOUS
  Filled 2017-10-31 (×2): qty 100
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
## 2017-11-01 ENCOUNTER — Inpatient Hospital Stay (HOSPITAL_COMMUNITY): Payer: Medicaid Other

## 2017-11-01 ENCOUNTER — Other Ambulatory Visit: Payer: Self-pay

## 2017-11-01 LAB — CBC
HCT: 42.7 % (ref 39.0–52.0)
HEMOGLOBIN: 13.7 g/dL (ref 13.0–17.0)
MCH: 29.7 pg (ref 26.0–34.0)
MCHC: 32.1 g/dL (ref 30.0–36.0)
MCV: 92.6 fL (ref 80.0–100.0)
NRBC: 0 % (ref 0.0–0.2)
PLATELETS: 157 10*3/uL (ref 150–400)
RBC: 4.61 MIL/uL (ref 4.22–5.81)
RDW: 13.8 % (ref 11.5–15.5)
WBC: 23.8 10*3/uL — ABNORMAL HIGH (ref 4.0–10.5)

## 2017-11-01 LAB — TYPE AND SCREEN
ABO/RH(D): O POS
Antibody Screen: NEGATIVE
UNIT DIVISION: 0
UNIT DIVISION: 0
UNIT DIVISION: 0
UNIT DIVISION: 0
UNIT DIVISION: 0
Unit division: 0
Unit division: 0
Unit division: 0
Unit division: 0
Unit division: 0
Unit division: 0
Unit division: 0

## 2017-11-01 LAB — BPAM RBC
BLOOD PRODUCT EXPIRATION DATE: 201911282359
BLOOD PRODUCT EXPIRATION DATE: 201911302359
BLOOD PRODUCT EXPIRATION DATE: 201912012359
BLOOD PRODUCT EXPIRATION DATE: 201912032359
BLOOD PRODUCT EXPIRATION DATE: 201912032359
Blood Product Expiration Date: 201911272359
Blood Product Expiration Date: 201911282359
Blood Product Expiration Date: 201911282359
Blood Product Expiration Date: 201912022359
Blood Product Expiration Date: 201912032359
Blood Product Expiration Date: 201912032359
Blood Product Expiration Date: 201912042359
ISSUE DATE / TIME: 201910270300
ISSUE DATE / TIME: 201910270300
ISSUE DATE / TIME: 201910270333
ISSUE DATE / TIME: 201910280820
ISSUE DATE / TIME: 201910270245
ISSUE DATE / TIME: 201910270245
ISSUE DATE / TIME: 201910270333
ISSUE DATE / TIME: 201910270333
ISSUE DATE / TIME: 201910270333
ISSUE DATE / TIME: 201910280820
UNIT TYPE AND RH: 9500
UNIT TYPE AND RH: 9500
UNIT TYPE AND RH: 9500
UNIT TYPE AND RH: 9500
Unit Type and Rh: 5100
Unit Type and Rh: 5100
Unit Type and Rh: 5100
Unit Type and Rh: 5100
Unit Type and Rh: 9500
Unit Type and Rh: 9500
Unit Type and Rh: 9500
Unit Type and Rh: 9500

## 2017-11-01 LAB — CREATININE, FLUID (PLEURAL, PERITONEAL, JP DRAINAGE): Creat, Fluid: 1 mg/dL

## 2017-11-01 LAB — URINALYSIS, ROUTINE W REFLEX MICROSCOPIC
BILIRUBIN URINE: NEGATIVE
Glucose, UA: NEGATIVE mg/dL
KETONES UR: 80 mg/dL — AB
LEUKOCYTES UA: NEGATIVE
Nitrite: NEGATIVE
PH: 7 (ref 5.0–8.0)
Protein, ur: 100 mg/dL — AB
Specific Gravity, Urine: 1.03 (ref 1.005–1.030)

## 2017-11-01 LAB — BASIC METABOLIC PANEL
Anion gap: 12 (ref 5–15)
BUN: 7 mg/dL (ref 6–20)
CHLORIDE: 102 mmol/L (ref 98–111)
CO2: 25 mmol/L (ref 22–32)
Calcium: 8.7 mg/dL — ABNORMAL LOW (ref 8.9–10.3)
Creatinine, Ser: 0.95 mg/dL (ref 0.61–1.24)
GFR calc Af Amer: >60 mL/min (ref >60)
GLUCOSE: 109 mg/dL — AB (ref 70–99)
POTASSIUM: 3.6 mmol/L (ref 3.5–5.1)
Sodium: 139 mmol/L (ref 135–145)

## 2017-11-01 MED ORDER — PHENOL 1.4 % MT LIQD
1.0000 | OROMUCOSAL | Status: DC | PRN
  Administered 2017-11-01: 1 via OROMUCOSAL
  Filled 2017-11-01: qty 177

## 2017-11-01 MED ORDER — METHOCARBAMOL 1000 MG/10ML IJ SOLN
500.0000 mg | Freq: Three times a day (TID) | INTRAVENOUS | Status: DC
  Administered 2017-11-01 – 2017-11-02 (×3): 500 mg via INTRAVENOUS
  Filled 2017-11-01 (×5): qty 5
  Filled 2017-11-01: qty 500

## 2017-11-01 MED ORDER — ACETAMINOPHEN 10 MG/ML IV SOLN
1000.0000 mg | Freq: Four times a day (QID) | INTRAVENOUS | Status: AC
  Administered 2017-11-01 – 2017-11-02 (×4): 1000 mg via INTRAVENOUS
  Filled 2017-11-01 (×4): qty 100

## 2017-11-01 MED ORDER — TETANUS-DIPHTH-ACELL PERTUSSIS 5-2.5-18.5 LF-MCG/0.5 IM SUSP
0.5000 mL | Freq: Once | INTRAMUSCULAR | Status: AC
  Administered 2017-11-01: 0.5 mL via INTRAMUSCULAR
  Filled 2017-11-01: qty 0.5
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
## 2017-11-01 NOTE — Plan of Care (Signed)
  Problem: Education: Goal: Knowledge of General Education information will improve Description: Including pain rating scale, medication(s)/side effects and non-pharmacologic comfort measures Outcome: Progressing   Problem: Coping: Goal: Level of anxiety will decrease Outcome: Progressing   Problem: Safety: Goal: Ability to remain free from injury will improve Outcome: Progressing   
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
## 2017-11-01 NOTE — Progress Notes (Signed)
PT Cancellation Note  Patient Details Name: Luis Castaneda. MRN: [id number] DOB: 1985/04/05   Cancelled Treatment:    Reason Eval/Treat Not Completed: Patient declined, no reason specified Pt declined participating in therapy at this time and reports he would like to get some sleep as he has not slept much the past 2 nights. PT will continue to follow acutely.    Derek Mound, PTA Acute Rehabilitation Services Pager: [phone] Office: [phone]   11/01/2017, 12:53 PM

## 2017-11-01 NOTE — Consult Note (Addendum)
WOC Nurse ostomy consult note Surgical team following for assessment and plan of care to abd wound.  Stoma type/location: Pt had colostomy surgery performed on 10/26; WOC consult requested today. Current pouch is leaking behind the barrier. Stomal assessment/size: Stoma red and viable, flush with skin level, 1 3/4 inches Peristomal assessment:  Intact skin surrounding Output: Small amt blood-tinged drainage; no stool or flautus at this time. Ostomy pouching: 2pc.  Education provided:  Demonstrated pouch change using barrier ring to attempt to maintain a seal and 2 piece pouching system.  Pt and girlfriend at the bedside watched the process and asked appropriate questions. Supplies and educational materials left at the bedside.  Reviewed pouching routines and ordering supplies. Pt does not have insurance; indigent supply contact information provided to the patient.   Enrolled patient in [employer] DC program: Yes WOC team will continue to follow for further teaching sessions. Cammie Mcgee MSN, RN, CWOCN, [place], CNS [phone]

## 2017-11-01 NOTE — Progress Notes (Signed)
[id number] patient refused dressing change. Agreed to do in morning.

## 2017-11-02 ENCOUNTER — Encounter (HOSPITAL_COMMUNITY): Payer: Self-pay | Admitting: General Practice

## 2017-11-02 LAB — BASIC METABOLIC PANEL
ANION GAP: 8 (ref 5–15)
BUN: 12 mg/dL (ref 6–20)
CALCIUM: 8.5 mg/dL — AB (ref 8.9–10.3)
CHLORIDE: 106 mmol/L (ref 98–111)
CO2: 26 mmol/L (ref 22–32)
Creatinine, Ser: 0.99 mg/dL (ref 0.61–1.24)
GFR calc non Af Amer: >60 mL/min (ref >60)
Glucose, Bld: 97 mg/dL (ref 70–99)
POTASSIUM: 3.3 mmol/L — AB (ref 3.5–5.1)
Sodium: 140 mmol/L (ref 135–145)

## 2017-11-02 LAB — URINE CULTURE: CULTURE: NO GROWTH

## 2017-11-02 LAB — CBC
HEMATOCRIT: 38.4 % — AB (ref 39.0–52.0)
HEMOGLOBIN: 12 g/dL — AB (ref 13.0–17.0)
MCH: 29.4 pg (ref 26.0–34.0)
MCHC: 31.3 g/dL (ref 30.0–36.0)
MCV: 94.1 fL (ref 80.0–100.0)
NRBC: 0 % (ref 0.0–0.2)
Platelets: 185 10*3/uL (ref 150–400)
RBC: 4.08 MIL/uL — AB (ref 4.22–5.81)
RDW: 13.6 % (ref 11.5–15.5)
WBC: 17.2 10*3/uL — AB (ref 4.0–10.5)

## 2017-11-02 MED ORDER — POTASSIUM CHLORIDE 10 MEQ/100ML IV SOLN
10.0000 meq | INTRAVENOUS | Status: AC
  Administered 2017-11-02 (×4): 10 meq via INTRAVENOUS
  Filled 2017-11-02 (×4): qty 100

## 2017-11-02 MED ORDER — OXYCODONE HCL 5 MG PO TABS
5.0000 mg | ORAL_TABLET | ORAL | Status: DC | PRN
  Administered 2017-11-02 – 2017-11-05 (×3): 10 mg via ORAL
  Filled 2017-11-02 (×5): qty 2

## 2017-11-02 MED ORDER — METHOCARBAMOL 500 MG PO TABS
500.0000 mg | ORAL_TABLET | Freq: Three times a day (TID) | ORAL | Status: DC
  Administered 2017-11-02 (×2): 500 mg via ORAL
  Filled 2017-11-02 (×2): qty 1

## 2017-11-02 MED ORDER — ACETAMINOPHEN 500 MG PO TABS
1000.0000 mg | ORAL_TABLET | Freq: Four times a day (QID) | ORAL | Status: DC
  Administered 2017-11-02 – 2017-11-05 (×10): 1000 mg via ORAL
  Filled 2017-11-02 (×12): qty 2

## 2017-11-02 MED ORDER — ENOXAPARIN SODIUM 40 MG/0.4ML ~~LOC~~ SOLN
40.0000 mg | SUBCUTANEOUS | Status: DC
  Administered 2017-11-03 – 2017-11-04 (×2): 40 mg via SUBCUTANEOUS
  Filled 2017-11-02 (×3): qty 0.4

## 2017-11-02 MED ORDER — LORAZEPAM 2 MG/ML IJ SOLN: 0.5000 mg | INTRAMUSCULAR | Status: DC | PRN
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
## 2017-11-03 LAB — CBC
HCT: 37.7 % — ABNORMAL LOW (ref 39.0–52.0)
HEMOGLOBIN: 12.5 g/dL — AB (ref 13.0–17.0)
MCH: 30.5 pg (ref 26.0–34.0)
MCHC: 33.2 g/dL (ref 30.0–36.0)
MCV: 92 fL (ref 80.0–100.0)
Platelets: 205 10*3/uL (ref 150–400)
RBC: 4.1 MIL/uL — AB (ref 4.22–5.81)
RDW: 13.3 % (ref 11.5–15.5)
WBC: 17.4 10*3/uL — ABNORMAL HIGH (ref 4.0–10.5)
nRBC: 0 % (ref 0.0–0.2)

## 2017-11-03 LAB — BASIC METABOLIC PANEL
ANION GAP: 13 (ref 5–15)
BUN: 11 mg/dL (ref 6–20)
CALCIUM: 8.6 mg/dL — AB (ref 8.9–10.3)
CHLORIDE: 103 mmol/L (ref 98–111)
CO2: 21 mmol/L — AB (ref 22–32)
Creatinine, Ser: 0.82 mg/dL (ref 0.61–1.24)
GFR calc non Af Amer: >60 mL/min (ref >60)
GLUCOSE: 90 mg/dL (ref 70–99)
POTASSIUM: 3.5 mmol/L (ref 3.5–5.1)
Sodium: 137 mmol/L (ref 135–145)

## 2017-11-03 MED ORDER — POTASSIUM CHLORIDE CRYS ER 20 MEQ PO TBCR
20.0000 meq | EXTENDED_RELEASE_TABLET | Freq: Two times a day (BID) | ORAL | Status: AC
  Administered 2017-11-03 – 2017-11-04 (×3): 20 meq via ORAL
  Filled 2017-11-03 (×4): qty 1

## 2017-11-03 MED ORDER — HYDROXYZINE HCL 25 MG PO TABS: 25.0000 mg | ORAL_TABLET | Freq: Three times a day (TID) | ORAL | Status: DC | PRN

## 2017-11-03 MED ORDER — METHOCARBAMOL 500 MG PO TABS
1000.0000 mg | ORAL_TABLET | Freq: Three times a day (TID) | ORAL | Status: DC
  Administered 2017-11-03 – 2017-11-04 (×4): 1000 mg via ORAL
  Filled 2017-11-03 (×5): qty 2
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
## 2017-11-03 NOTE — Progress Notes (Signed)
Patient requested to have the dressing change later today because he wanted to get sleep.  Will endorse to day shift RN appropriately.

## 2017-11-03 NOTE — Consult Note (Addendum)
WOC Nurse ostomy consult note Surgical team following for assessment and plan of care to abd wound.  Stoma type/location: Pt had colostomy surgery performed on 10/26 Stomal assessment/size: Stoma red and viable, flush with skin level, 1 3/4 inches Peristomal assessment:  Intact skin surrounding Output: small amt brown liquid stool Ostomy pouching: 2pc.  Education provided:  Demonstrated pouch change using barrier ring to attempt to maintain a seal and 1 piece pouching system. Pt assisted with the process and asked appropriate questions. Supplies and educational materials left at the bedside, along with 6 sets of pouches and barrier rings for when patient is discharged home. Reviewed pouching routines and ordering supplies. Pt does not have insurance; indigent supply contact information previously provided to the patient.  Pt was able to open and close velcro to empty. Enrolled patient in [employer] DC program: Yes WOC team will continue to follow for further teaching sessions. Cammie Mcgee MSN, RN, CWOCN, [place], CNS [phone]

## 2017-11-03 NOTE — Progress Notes (Signed)
Orthopedic Tech Progress Note Patient Details:  Luis Castaneda. [birth year] [id number]  Ortho Devices Type of Ortho Device: Ace wrap, Ulna gutter splint Ortho Device/Splint Location: lue Ortho Device/Splint Interventions: Application   Post Interventions Patient Tolerated: Well Instructions Provided: Care of device   Nikki Dom 11/03/2017, 3:28 PM
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
## 2017-11-04 ENCOUNTER — Inpatient Hospital Stay (HOSPITAL_COMMUNITY): Payer: Medicaid Other

## 2017-11-04 MED ORDER — METHOCARBAMOL 500 MG PO TABS
1000.0000 mg | ORAL_TABLET | Freq: Three times a day (TID) | ORAL | Status: DC
  Administered 2017-11-05: 1000 mg via ORAL
  Filled 2017-11-04: qty 2

## 2017-11-04 MED ORDER — FAMOTIDINE 20 MG PO TABS
20.0000 mg | ORAL_TABLET | Freq: Two times a day (BID) | ORAL | Status: DC
  Administered 2017-11-04 – 2017-11-05 (×3): 20 mg via ORAL
  Filled 2017-11-04: qty 1
  Filled 2017-11-04: qty 2
  Filled 2017-11-04: qty 1

## 2017-11-04 NOTE — Plan of Care (Signed)
  Problem: Education: Goal: Knowledge of General Education information will improve Description Including pain rating scale, medication(s)/side effects and non-pharmacologic comfort measures Outcome: Progressing Note:  POC reviewed with pt.; showed pt. how to empty colostomy bag.
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
## 2017-11-04 NOTE — Progress Notes (Signed)
Pt. little anxious about colostomy draining a lot; explained to pt. that he's still on liquids- no solid food; support given.

## 2017-11-04 NOTE — Progress Notes (Signed)
Nauseated and didn't want to take po meds-Tylenol, KCL, and Robaxin; Zofran given.
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
## 2017-11-05 LAB — BASIC METABOLIC PANEL
Anion gap: 9 (ref 5–15)
BUN: 12 mg/dL (ref 6–20)
CALCIUM: 8.7 mg/dL — AB (ref 8.9–10.3)
CO2: 22 mmol/L (ref 22–32)
CREATININE: 0.94 mg/dL (ref 0.61–1.24)
Chloride: 103 mmol/L (ref 98–111)
GFR calc Af Amer: >60 mL/min (ref >60)
GLUCOSE: 102 mg/dL — AB (ref 70–99)
Potassium: 3.7 mmol/L (ref 3.5–5.1)
Sodium: 134 mmol/L — ABNORMAL LOW (ref 135–145)

## 2017-11-05 LAB — CBC
HCT: 41.4 % (ref 39.0–52.0)
Hemoglobin: 13.9 g/dL (ref 13.0–17.0)
MCH: 30.3 pg (ref 26.0–34.0)
MCHC: 33.6 g/dL (ref 30.0–36.0)
MCV: 90.4 fL (ref 80.0–100.0)
PLATELETS: 309 10*3/uL (ref 150–400)
RBC: 4.58 MIL/uL (ref 4.22–5.81)
RDW: 13.5 % (ref 11.5–15.5)
WBC: 21 10*3/uL — ABNORMAL HIGH (ref 4.0–10.5)
nRBC: 0 % (ref 0.0–0.2)

## 2017-11-05 MED ORDER — OXYCODONE HCL 5 MG PO TABS: 5.0000 mg | ORAL_TABLET | ORAL | 0 refills | Status: AC | PRN
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
## 2017-11-05 NOTE — Discharge Instructions (Signed)
You will need to follow up with an orthopedic surgeon in 1 week for your left hand. You will likely need a cast placed at that time. You should also follow up with a general or trauma surgeon in 1-2 weeks to make sure your wound is healing appropriately. If you can't get in to see a surgeon there is a wound center at [facility].    [facility]      [facility], [place] [phone]  OPEN ABDOMINAL SURGERY: POST OP INSTRUCTIONS  Always review your discharge instruction sheet given to you by the facility where your surgery was performed.  IF YOU HAVE DISABILITY OR FAMILY LEAVE FORMS, YOU MUST BRING THEM TO THE OFFICE FOR PROCESSING.  PLEASE DO NOT GIVE THEM TO YOUR DOCTOR.  1. A prescription for pain medication may be given to you upon discharge.  Take your pain medication as prescribed, if needed.  If narcotic pain medicine is not needed, then you may take acetaminophen (Tylenol) or ibuprofen (Advil) as needed. 2. Take your usually prescribed medications unless otherwise directed. 3. If you need a refill on your pain medication, please contact your pharmacy. They will contact our office to request authorization.  Prescriptions will not be filled after 5pm or on week-ends. 4. You should follow a light diet the first few days after arrival home, such as soup and crackers, pudding, etc.unless your doctor has advised otherwise. A high-fiber, low fat diet can be resumed as tolerated.   Be sure to include lots of fluids daily. Most patients will experience some swelling and bruising on the chest and neck area.  Ice packs will help.  Swelling and bruising can take several days to resolve 5. Most patients will experience some swelling and bruising in the area of the incision. Ice pack will help. Swelling and bruising can take several days to resolve..  6. It is common to experience some constipation if taking pain medication after surgery.  Increasing fluid intake and taking a stool softener will  usually help or prevent this problem from occurring.  A mild laxative (Milk of Magnesia or Miralax) should be taken according to package directions if there are no bowel movements after 48 hours. 7.  You may have steri-strips (small skin tapes) in place directly over the incision.  These strips should be left on the skin for 7-10 days.  If your surgeon used skin glue on the incision, you may shower in 24 hours.  The glue will flake off over the next 2-3 weeks.  Any sutures or staples will be removed at the office during your follow-up visit. You may find that a light gauze bandage over your incision may keep your staples from being rubbed or pulled. You may shower and replace the bandage daily. 8. ACTIVITIES:  You may resume regular (light) daily activities beginning the next day--such as daily self-care, walking, climbing stairs--gradually increasing activities as tolerated.  You may have sexual intercourse when it is comfortable.  Refrain from any heavy lifting or straining until approved by your doctor. a. You may drive when you no longer are taking prescription pain medication, you can comfortably wear a seatbelt, and you can safely maneuver your car and apply brakes b. Return to Work: _____________when cleared by provider______________________ 9. You should see your doctor in the office for a follow-up appointment approximately two weeks after your surgery.  Make sure that you call for this appointment within a day or two after you arrive home to insure a  convenient appointment time.   WHEN TO CALL YOUR DOCTOR: 1. Fever over 101.0 2. Inability to urinate 3. Nausea and/or vomiting 4. Extreme swelling or bruising 5. Continued bleeding from incision. 6. Increased pain, redness, or drainage from the incision. 7. Difficulty swallowing or breathing 8. Muscle cramping or spasms. 9. Numbness or tingling in hands or feet or around lips.  The clinic staff is available to answer your questions during  regular business hours.  Please dont hesitate to call and ask to speak to one of the nurses if you have concerns.  For further questions, please visit [email or web address]  MIDLINE WOUND CARE: - midline dressing to be changed twice daily - supplies: sterile saline, gauze, scissors, ABD pads, tape  - remove dressing and all packing carefully, moistening with sterile saline as needed to avoid packing/internal dressing sticking to the wound. - clean edges of skin around the wound with water/gauze, making sure there is no tape debris or leakage left on skin that could cause skin irritation or breakdown. - dampen and clean gauze with sterile saline and pack wound from wound base to skin level, making sure to take note of any possible areas of wound tracking, tunneling and packing appropriately. Wound can be packed loosely. Trim gauze to size if needed. - cover wound with a dry ABD pad and secure with tape.  - write the date/time on the dry dressing/tape to better track when the last dressing change occurred. - apply any skin protectant/powder recommended by clinician to protect skin/skin folds. - change dressing as needed if leakage occurs, wound gets contaminated, or patient requests to shower. - patient may shower daily with wound open and following the shower the wound should be dried and a clean dressing placed.    Colostomy Home Guide, Adult A colostomy is a surgical procedure to make an opening (stoma) for stool (feces) to leave your body. This surgery is done when a medical condition prevents stool from leaving your body through the end of the large intestine (rectum). During the surgery, part of the large intestine (colon) is attached to the stoma that is made in the front of your abdomen. A bag (pouch) is fitted over the stoma. Stool and gas will collect in the bag. After having this surgery, you will need to empty and change your colostomy bag as needed. You will also need to care  for the stoma. How do I care for my stoma? Your stoma should look pink, red, and moist, like the inside of your cheek. At first, the stoma may be swollen, but this swelling will go away within 6 weeks. To care for the stoma:  Keep the skin around the stoma clean and dry.  Use a clean, soft washcloth to gently wash the stoma and the skin around it. ? Use warm water and only use cleansers recommended by your health care provider. ? Rinse the stoma area with plain water. ? Dry the area well.  Use stoma powder or ointment on your skin only as told by your health care provider. Do not use any other powders, gels, wipes, or creams on your skin.  Change your colostomy bag if your skin becomes irritated. Irritation may indicate that the bag is leaking.  Check your stoma area every day for signs of infection. Check for: ? More redness, swelling, or pain. ? More fluid or blood. ? Pus or warmth.  Measure the stoma opening regularly and record the size. Watch for changes. Share this information with your  health care provider.  How do I care for my colostomy bag? The bag that fits over the stoma can have either one or two pieces.  One-piece bag: The skin barrier and the bag are combined in a single unit.  Two-piece bag: The skin barrier and the bag are separate pieces that attach to each other.  Empty your bag at bedtime and whenever it is one-third to one-half full. Do not let more stool or gas build up. This could cause the bag to leak. Some colostomy bags have a built-in gas release valve. Change the bag every 3-4 days or as told by your health care provider. Also change the bag if it is leaking or separating from the skin or your skin looks irritated. How do I empty my colostomy bag? Before you leave the hospital, you will be taught how to empty your bag. Follow these basic steps: 1. Wash your hands with soap and water. 2. Sit far back on the toilet. 3. Put several pieces of toilet paper  into the toilet water. This will prevent splashing as you empty the stool into the toilet. 4. Remove the clip or the velcro from the tail end of the bag. 5. Unroll the tail, then empty stool into the toilet. 6. Clean the tail with toilet paper. 7. Reroll the tail, and close it with the clip or velcro. 8. Wash your hands again.  How do I change my colostomy bag? Before you leave the hospital, you will be taught how to change your bag. Always have colostomy supplies with you, and follow these basic steps: 1. Wash your hands with soap and water. Have paper towels or tissues near you to clean any discharge. 2. Use a template to pre-cut the skin barrier. Smooth any rough edges. 3. If using a two-piece bag, attach the bag and the skin barrier to each other. Add the barrier ring, if you use one. 4. If your stools are watery, add a few cotton balls to the new bag to absorb the liquid. 5. Remove the old bag and skin barrier. Gently push the skin away from the barrier with your fingers or a warm cloth. 6. Wash your hands again. Then clean the stoma area as directed with water or with mild soap and water. Use water to rinse away any soap. 7. Dry the skin. You may use the cool setting on a hair dryer to do this. 8. If directed, apply stoma powder or skin barrier gel to the skin. 9. Dry the skin again. 10. Warm the skin barrier with your hands or a warm compress. 11. Remove the paper from the sticky (adhesive) strip of the skin barrier. 12. Press the adhesive strip onto the skin around the stoma. 13. Gently rub the skin barrier onto the skin. This creates heat that helps the barrier to stick. 14. Apply stoma tape to the edges of the skin barrier.  What are some general tips?  Avoid wearing clothes that are tight directly over your stoma.  You may shower or bathe with the colostomy bag on or off. Do not use harsh or oily soaps or lotions. Dry the skin and bag after bathing.  Store all supplies in a  cool, dry place. Do not leave supplies in extreme heat because parts can melt.  Whenever you leave home, take an extra skin barrier and colostomy bag with you.  If your colostomy bag gets wet, you can dry it with a hair dryer on the cool setting.  To prevent odor, put drops of ostomy deodorizer in the colostomy bag. Your health care provider may also recommend putting ostomy lubricant inside the bag. This helps the stool to slide out of the bag more easily and completely. Contact a health care provider if:  You have more redness, swelling, or pain around your stoma.  You have more fluid or blood coming from your stoma.  Your stoma feels warm to the touch.  You have pus coming from your stoma.  Your stoma extends in or out farther than normal.  You need to change the bag every day.  You have a fever. Get help right away if:  Your stool is bloody.  You vomit.  You have trouble breathing. This information is not intended to replace advice given to you by your health care provider. Make sure you discuss any questions you have with your health care provider. Document Released: 12/23/2002 Document Revised: 04/30/2015 Document Reviewed: 04/24/2013 Elsevier Interactive Patient Education  [employer].
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
## 2020-10-22 IMAGING — DX DG CHEST 1V PORT
1 series · 1 of 1 positions shown · non-contrast
Comparison: 10/30/2017

CLINICAL DATA: Leukocytosis.

EXAM:
PORTABLE CHEST 1 VIEW

[chest ap]
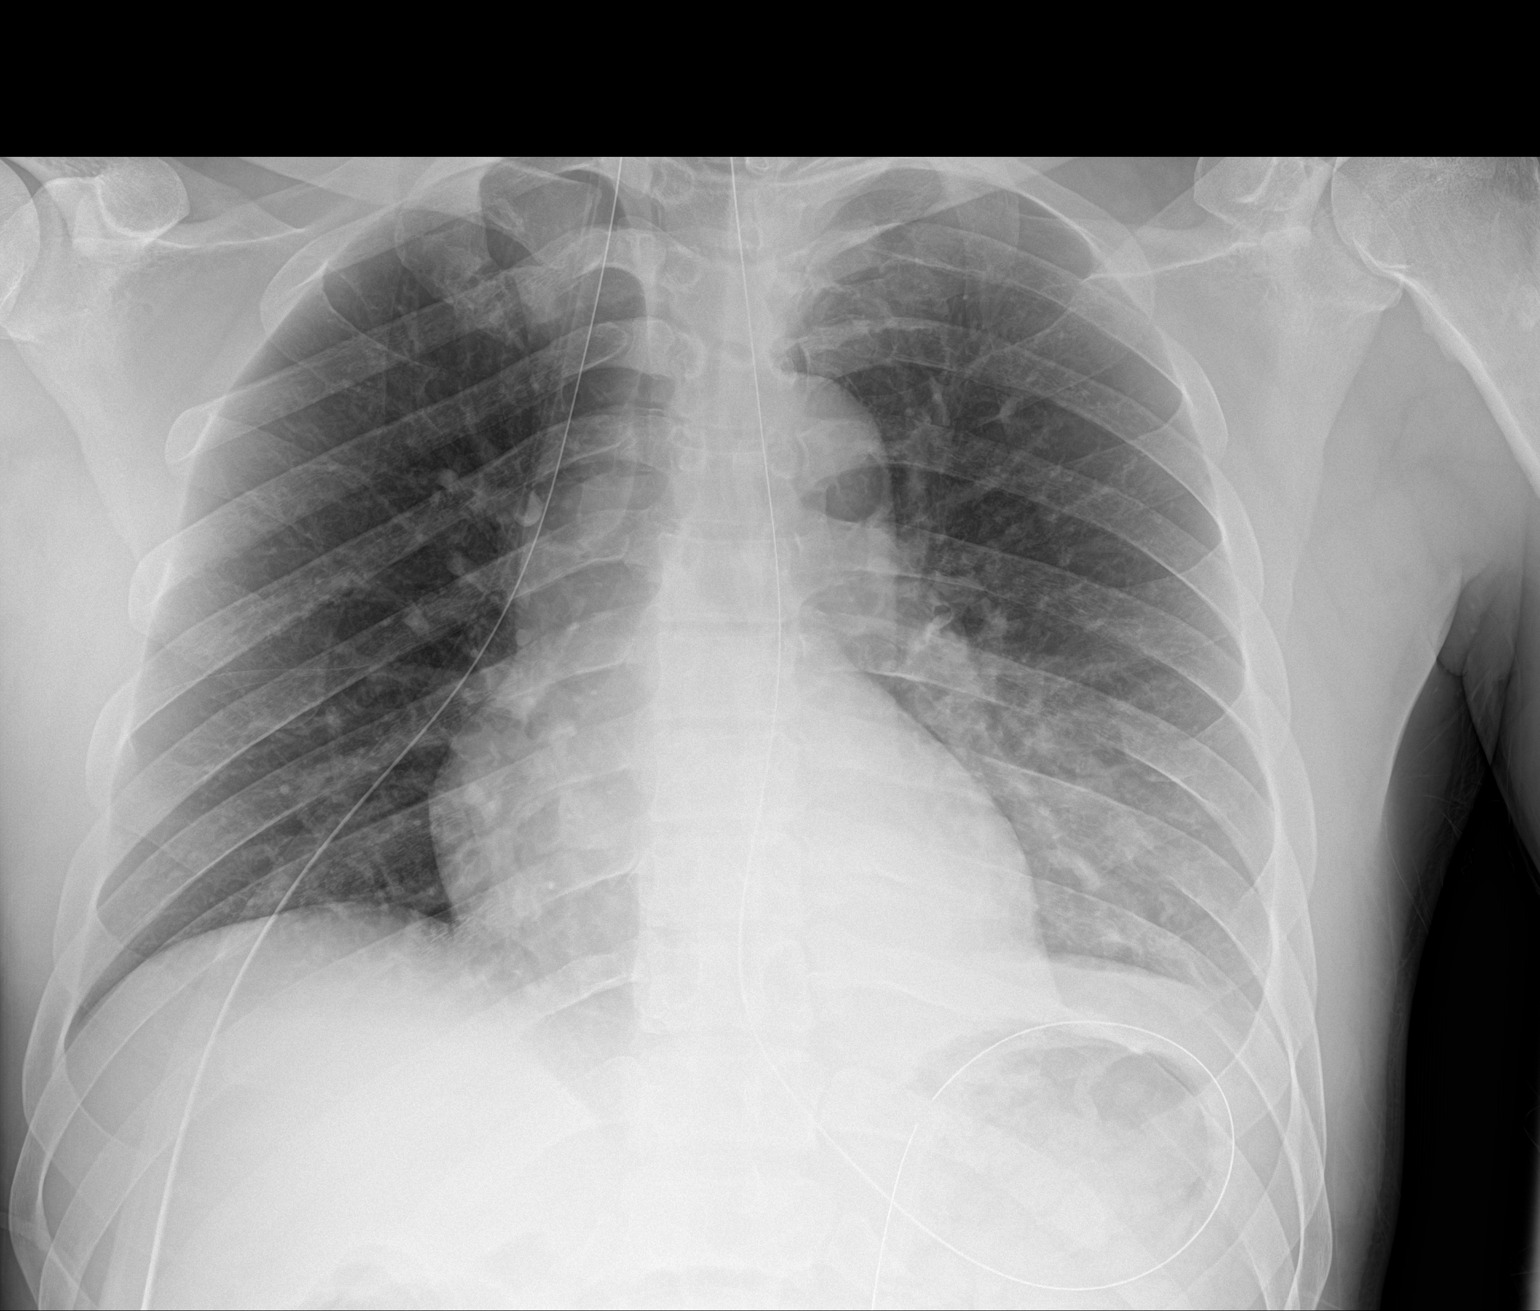

[1 of 1 positions shown; findings below may reference images not displayed]

FINDINGS: Nasogastric tube is coiled over the stomach with side-port in the
stomach and tip coursing off the inferior portion of the film and
not visualized. Interval removal of endotracheal tube. Lungs are
adequately inflated with mild hazy density over the medial left base
which may be due to atelectasis, layering effusion or infection.
Cardiomediastinal silhouette and remainder of the exam is unchanged.
IMPRESSION: Mild hazy opacification over the medial left base which may be due
to layering effusion/atelectasis or infection.

Nasogastric tube coiled over the stomach unchanged.
